# Patient Record
Sex: Female | Born: 1970 | Race: White | Hispanic: No | Marital: Married | Smoking: Never smoker
Health system: Southern US, Community
[De-identification: ages and names within clinical notes are randomized; demographics above are authoritative.]

## PROBLEM LIST (undated history)

## (undated) DIAGNOSIS — R519 Headache, unspecified: Secondary | ICD-10-CM

## (undated) HISTORY — DX: Headache, unspecified: R51.9

---

## 1997-12-29 ENCOUNTER — Encounter: Admission: RE | Admit: 1997-12-29 | Discharge: 1998-03-29 | Payer: Self-pay | Admitting: Family Medicine

## 1999-04-21 ENCOUNTER — Other Ambulatory Visit: Admission: RE | Admit: 1999-04-21 | Discharge: 1999-04-21 | Payer: Self-pay | Admitting: Obstetrics and Gynecology

## 2000-04-17 ENCOUNTER — Other Ambulatory Visit: Admission: RE | Admit: 2000-04-17 | Discharge: 2000-04-17 | Payer: Self-pay | Admitting: Obstetrics and Gynecology

## 2001-04-17 ENCOUNTER — Other Ambulatory Visit: Admission: RE | Admit: 2001-04-17 | Discharge: 2001-04-17 | Payer: Self-pay | Admitting: Obstetrics and Gynecology

## 2003-02-04 ENCOUNTER — Ambulatory Visit (HOSPITAL_COMMUNITY): Admission: RE | Admit: 2003-02-04 | Discharge: 2003-02-04 | Payer: Self-pay | Admitting: Orthopedic Surgery

## 2003-02-04 ENCOUNTER — Ambulatory Visit (HOSPITAL_BASED_OUTPATIENT_CLINIC_OR_DEPARTMENT_OTHER): Admission: RE | Admit: 2003-02-04 | Discharge: 2003-02-04 | Payer: Self-pay | Admitting: Orthopedic Surgery

## 2003-05-25 ENCOUNTER — Other Ambulatory Visit: Admission: RE | Admit: 2003-05-25 | Discharge: 2003-05-25 | Payer: Self-pay | Admitting: Obstetrics and Gynecology

## 2003-05-28 ENCOUNTER — Ambulatory Visit (HOSPITAL_COMMUNITY): Admission: RE | Admit: 2003-05-28 | Discharge: 2003-05-28 | Payer: Self-pay | Admitting: Obstetrics and Gynecology

## 2004-05-30 ENCOUNTER — Other Ambulatory Visit: Admission: RE | Admit: 2004-05-30 | Discharge: 2004-05-30 | Payer: Self-pay | Admitting: Obstetrics and Gynecology

## 2005-09-14 ENCOUNTER — Inpatient Hospital Stay (HOSPITAL_COMMUNITY): Admission: AD | Admit: 2005-09-14 | Discharge: 2005-09-17 | Payer: Self-pay | Admitting: Obstetrics and Gynecology

## 2012-02-12 ENCOUNTER — Other Ambulatory Visit: Payer: Self-pay | Admitting: Family Medicine

## 2012-02-12 ENCOUNTER — Other Ambulatory Visit (HOSPITAL_COMMUNITY)
Admission: RE | Admit: 2012-02-12 | Discharge: 2012-02-12 | Disposition: A | Discharge status: Home / Self Care | Payer: BC Managed Care – PPO | Source: Ambulatory Visit | Attending: Family Medicine | Admitting: Family Medicine

## 2012-02-12 DIAGNOSIS — Z124 Encounter for screening for malignant neoplasm of cervix: Secondary | ICD-10-CM | POA: Insufficient documentation

## 2013-10-16 ENCOUNTER — Other Ambulatory Visit: Payer: Self-pay

## 2013-10-16 DIAGNOSIS — Z1231 Encounter for screening mammogram for malignant neoplasm of breast: Secondary | ICD-10-CM

## 2013-10-29 ENCOUNTER — Ambulatory Visit
Admission: RE | Admit: 2013-10-29 | Discharge: 2013-10-29 | Disposition: A | Discharge status: Home / Self Care | Payer: BC Managed Care – PPO | Source: Ambulatory Visit

## 2013-10-29 DIAGNOSIS — Z1231 Encounter for screening mammogram for malignant neoplasm of breast: Secondary | ICD-10-CM

## 2014-09-28 ENCOUNTER — Other Ambulatory Visit: Payer: Self-pay

## 2014-09-28 DIAGNOSIS — Z1231 Encounter for screening mammogram for malignant neoplasm of breast: Secondary | ICD-10-CM

## 2014-11-02 ENCOUNTER — Ambulatory Visit
Admission: RE | Admit: 2014-11-02 | Discharge: 2014-11-02 | Disposition: A | Discharge status: Home / Self Care | Payer: BLUE CROSS/BLUE SHIELD | Source: Ambulatory Visit

## 2014-11-02 DIAGNOSIS — Z1231 Encounter for screening mammogram for malignant neoplasm of breast: Secondary | ICD-10-CM

## 2015-03-16 ENCOUNTER — Other Ambulatory Visit: Payer: Self-pay | Admitting: Family Medicine

## 2015-03-16 ENCOUNTER — Other Ambulatory Visit (HOSPITAL_COMMUNITY)
Admission: RE | Admit: 2015-03-16 | Discharge: 2015-03-16 | Disposition: A | Discharge status: Home / Self Care | Payer: BLUE CROSS/BLUE SHIELD | Source: Ambulatory Visit | Attending: Family Medicine | Admitting: Family Medicine

## 2015-03-16 DIAGNOSIS — Z124 Encounter for screening for malignant neoplasm of cervix: Secondary | ICD-10-CM | POA: Diagnosis not present

## 2015-03-18 LAB — CYTOLOGY - PAP

## 2015-12-22 ENCOUNTER — Other Ambulatory Visit: Payer: Self-pay | Admitting: Family Medicine

## 2015-12-22 DIAGNOSIS — Z1231 Encounter for screening mammogram for malignant neoplasm of breast: Secondary | ICD-10-CM

## 2016-01-17 ENCOUNTER — Ambulatory Visit: Payer: BLUE CROSS/BLUE SHIELD

## 2016-01-31 ENCOUNTER — Ambulatory Visit
Admission: RE | Admit: 2016-01-31 | Discharge: 2016-01-31 | Disposition: A | Discharge status: Home / Self Care | Payer: BC Managed Care – PPO | Source: Ambulatory Visit | Attending: Family Medicine | Admitting: Family Medicine

## 2016-01-31 DIAGNOSIS — Z1231 Encounter for screening mammogram for malignant neoplasm of breast: Secondary | ICD-10-CM

## 2016-11-08 ENCOUNTER — Other Ambulatory Visit: Payer: Self-pay | Admitting: Family Medicine

## 2016-11-08 DIAGNOSIS — Z1231 Encounter for screening mammogram for malignant neoplasm of breast: Secondary | ICD-10-CM

## 2016-12-10 ENCOUNTER — Ambulatory Visit: Payer: BC Managed Care – PPO

## 2017-01-04 ENCOUNTER — Ambulatory Visit
Admission: RE | Admit: 2017-01-04 | Discharge: 2017-01-04 | Disposition: A | Discharge status: Home / Self Care | Payer: BC Managed Care – PPO | Source: Ambulatory Visit | Attending: Family Medicine | Admitting: Family Medicine

## 2017-01-04 DIAGNOSIS — Z1231 Encounter for screening mammogram for malignant neoplasm of breast: Secondary | ICD-10-CM

## 2017-11-26 ENCOUNTER — Other Ambulatory Visit: Payer: Self-pay | Admitting: Family Medicine

## 2017-11-26 DIAGNOSIS — Z1231 Encounter for screening mammogram for malignant neoplasm of breast: Secondary | ICD-10-CM

## 2018-01-09 ENCOUNTER — Ambulatory Visit
Admission: RE | Admit: 2018-01-09 | Discharge: 2018-01-09 | Disposition: A | Discharge status: Home / Self Care | Payer: BC Managed Care – PPO | Source: Ambulatory Visit | Attending: Family Medicine | Admitting: Family Medicine

## 2018-01-09 DIAGNOSIS — Z1231 Encounter for screening mammogram for malignant neoplasm of breast: Secondary | ICD-10-CM

## 2018-12-24 ENCOUNTER — Other Ambulatory Visit: Payer: Self-pay | Admitting: Family Medicine

## 2018-12-24 DIAGNOSIS — Z1231 Encounter for screening mammogram for malignant neoplasm of breast: Secondary | ICD-10-CM

## 2019-02-06 ENCOUNTER — Other Ambulatory Visit: Payer: Self-pay

## 2019-02-06 ENCOUNTER — Ambulatory Visit
Admission: RE | Admit: 2019-02-06 | Discharge: 2019-02-06 | Disposition: A | Discharge status: Home / Self Care | Payer: BC Managed Care – PPO | Source: Ambulatory Visit | Attending: Family Medicine | Admitting: Family Medicine

## 2019-02-06 DIAGNOSIS — Z1231 Encounter for screening mammogram for malignant neoplasm of breast: Secondary | ICD-10-CM

## 2019-02-10 ENCOUNTER — Other Ambulatory Visit: Payer: Self-pay | Admitting: Family Medicine

## 2019-02-10 DIAGNOSIS — N6489 Other specified disorders of breast: Secondary | ICD-10-CM

## 2019-02-11 ENCOUNTER — Other Ambulatory Visit: Payer: Self-pay | Admitting: Nurse Practitioner

## 2019-02-11 DIAGNOSIS — N6489 Other specified disorders of breast: Secondary | ICD-10-CM

## 2019-02-24 ENCOUNTER — Ambulatory Visit
Admission: RE | Admit: 2019-02-24 | Discharge: 2019-02-24 | Disposition: A | Discharge status: Home / Self Care | Payer: BC Managed Care – PPO | Source: Ambulatory Visit | Attending: Family Medicine | Admitting: Family Medicine

## 2019-02-24 ENCOUNTER — Other Ambulatory Visit: Payer: Self-pay

## 2019-02-24 ENCOUNTER — Ambulatory Visit: Admission: RE | Admit: 2019-02-24 | Payer: BC Managed Care – PPO | Source: Ambulatory Visit

## 2019-02-24 DIAGNOSIS — N6489 Other specified disorders of breast: Secondary | ICD-10-CM

## 2019-07-14 ENCOUNTER — Other Ambulatory Visit: Payer: Self-pay | Admitting: Family Medicine

## 2019-07-14 ENCOUNTER — Other Ambulatory Visit (HOSPITAL_COMMUNITY)
Admission: RE | Admit: 2019-07-14 | Discharge: 2019-07-14 | Disposition: A | Discharge status: Home / Self Care | Payer: BC Managed Care – PPO | Source: Ambulatory Visit | Attending: Family Medicine | Admitting: Family Medicine

## 2019-07-14 DIAGNOSIS — Z124 Encounter for screening for malignant neoplasm of cervix: Secondary | ICD-10-CM | POA: Insufficient documentation

## 2019-07-16 LAB — CYTOLOGY - PAP: Diagnosis: NEGATIVE

## 2019-11-16 ENCOUNTER — Other Ambulatory Visit: Payer: Self-pay | Admitting: Family Medicine

## 2019-11-16 DIAGNOSIS — Z1231 Encounter for screening mammogram for malignant neoplasm of breast: Secondary | ICD-10-CM

## 2020-02-23 ENCOUNTER — Encounter: Payer: Self-pay | Admitting: Neurology

## 2020-02-26 ENCOUNTER — Ambulatory Visit
Admission: RE | Admit: 2020-02-26 | Discharge: 2020-02-26 | Disposition: A | Discharge status: Home / Self Care | Payer: BC Managed Care – PPO | Source: Ambulatory Visit | Attending: Family Medicine | Admitting: Family Medicine

## 2020-02-26 ENCOUNTER — Other Ambulatory Visit: Payer: Self-pay

## 2020-02-26 DIAGNOSIS — Z1231 Encounter for screening mammogram for malignant neoplasm of breast: Secondary | ICD-10-CM

## 2020-03-02 ENCOUNTER — Other Ambulatory Visit: Payer: Self-pay | Admitting: Family Medicine

## 2020-03-02 DIAGNOSIS — R928 Other abnormal and inconclusive findings on diagnostic imaging of breast: Secondary | ICD-10-CM

## 2020-03-18 ENCOUNTER — Ambulatory Visit
Admission: RE | Admit: 2020-03-18 | Discharge: 2020-03-18 | Disposition: A | Discharge status: Home / Self Care | Payer: BC Managed Care – PPO | Source: Ambulatory Visit | Attending: Family Medicine | Admitting: Family Medicine

## 2020-03-18 ENCOUNTER — Other Ambulatory Visit: Payer: Self-pay

## 2020-03-18 DIAGNOSIS — R928 Other abnormal and inconclusive findings on diagnostic imaging of breast: Secondary | ICD-10-CM

## 2020-04-29 ENCOUNTER — Ambulatory Visit: Payer: Self-pay | Admitting: Neurology

## 2020-06-29 NOTE — Progress Notes (Signed)
NEUROLOGY CONSULTATION NOTE  Shannon Hanna MRN: [id number] DOB: 01-21-1970  Referring provider: Maurice Small, MD Primary care provider: Maurice Small, MD  Reason for consult:  headache, vertigo  Assessment/Plan:    Probable peripheral vertigo - I do not think it is migrainous and I do not think it is secondary to an intracranial abnormality Headaches may be migraines, however they are infrequent and manageable  No further workup warranted.  If there is concern for OSA, recommend that it is evaluated.  Otherwise, follow up as needed.   Subjective:  Shannon Hanna is a 50 year old right-handed female who presents for headaches and vertigo.  History supplemented by referring provider's note.  Sitting upright at work and then later standing in the playground - spinning - double vision - no headache, nausea, lasted 5 to 7 minutes  In October, she was sitting sitting at work when she developed sudden onset spinning sensation with double vision.  No associated headache, nausea, or lateralizing symptoms.  Later that day, she was standing outside when it occurred again.  Both episodes lasted 5-7 minutes.  In April, she was eating lunch when she went to stand up and the vertigo occurred again, also lasting about 5-7 minutes.  She has not had a recurrence since then.    For a couple of years, she reports headache described as severe pounding pain in the occipital region and sometimes also involving the frontal region.  There is associated photophobia and phonophobia but no nausea, vomiting, visual disturbance or dizziness.  She will treat with caffeine and Tylenol.  It lasts about 30 minutes.  They are sporadic and infrequent.    She does not drink coffee but may have one caffeinated beverage a day, often a Mt Dew.  She usually drinks 40-50 oz water, sometimes less during the summer when she is out of work (she is an Data processing manager).  She does not exercise.  She snores and there  is question of underlying OSA.  CBC and CMP from February reviewed and were unremarkable except for anemia.    Current medications:  sertraline 50mg  daily.  PAST MEDICAL HISTORY: Past Medical History:  Diagnosis Date   Headache     PAST SURGICAL HISTORY: No past surgical history on file.   MEDICATIONS: Current Outpatient Medications on File Prior to Visit  Medication Sig Dispense Refill   ezetimibe (ZETIA) 10 MG tablet 1 tablet     sertraline (ZOLOFT) 50 MG tablet 1 tablet     No current facility-administered medications on file prior to visit.      ALLERGIES: Allergies  Allergen Reactions   Statins Rash    FAMILY HISTORY: No family history on file.  Objective:  Blood pressure (!) 174/95, pulse 97, height 5\' 6"  (1.676 m), weight 206 lb (93.4 kg), SpO2 97 %. General: No acute distress.  Patient appears well-groomed.   Head:  Normocephalic/atraumatic Eyes:  fundi examined but not visualized Neck: supple, no paraspinal tenderness, full range of motion Back: No paraspinal tenderness Heart: regular rate and rhythm Lungs: Clear to auscultation bilaterally. Vascular: No carotid bruits. Neurological Exam: Mental status: alert and oriented to person, place, and time, recent and remote memory intact, fund of knowledge intact, attention and concentration intact, speech fluent and not dysarthric, language intact. Cranial nerves: CN I: not tested CN II: pupils equal, round and reactive to light, visual fields intact CN III, IV, VI:  full range of motion, no nystagmus, no ptosis CN V: facial sensation  intact. CN VII: upper and lower face symmetric CN VIII: hearing intact CN IX, X: gag intact, uvula midline CN XI: sternocleidomastoid and trapezius muscles intact CN XII: tongue midline Bulk & Tone: normal, no fasciculations. Motor:  muscle strength 5/5 throughout Sensation:  Pinprick, temperature and vibratory sensation intact. Deep Tendon Reflexes:  2+ throughout,  toes  downgoing.   Finger to nose testing:  Without dysmetria.   Heel to shin:  Without dysmetria.   Gait:  Normal station and stride.  Romberg negative.    Thank you for allowing me to take part in the care of this patient.  Shon Millet, DO  CC: Maurice Small, MD

## 2020-06-30 ENCOUNTER — Ambulatory Visit: Payer: BC Managed Care – PPO | Admitting: Neurology

## 2020-06-30 ENCOUNTER — Other Ambulatory Visit: Payer: Self-pay

## 2020-06-30 ENCOUNTER — Encounter: Payer: Self-pay | Admitting: Neurology

## 2020-06-30 VITALS — BP 174/95 | HR 97 | Ht 66.0 in | Wt 206.0 lb

## 2020-06-30 DIAGNOSIS — R42 Dizziness and giddiness: Secondary | ICD-10-CM

## 2020-06-30 DIAGNOSIS — G43009 Migraine without aura, not intractable, without status migrainosus: Secondary | ICD-10-CM | POA: Diagnosis not present

## 2020-06-30 NOTE — Patient Instructions (Signed)
I think the dizzy episodes are just regular vertigo.  I don't think it is related to migraines and definitely not due to anything in the brain.  The headaches that you have are probably migraines.  As long as they are infrequent, I wouldn't do anything more.

## 2020-12-28 ENCOUNTER — Other Ambulatory Visit: Payer: Self-pay | Admitting: Family Medicine

## 2020-12-28 DIAGNOSIS — Z1231 Encounter for screening mammogram for malignant neoplasm of breast: Secondary | ICD-10-CM

## 2021-03-02 ENCOUNTER — Ambulatory Visit
Admission: RE | Admit: 2021-03-02 | Discharge: 2021-03-02 | Disposition: A | Discharge status: Home / Self Care | Payer: BC Managed Care – PPO | Source: Ambulatory Visit | Attending: Family Medicine | Admitting: Family Medicine

## 2021-03-02 DIAGNOSIS — Z1231 Encounter for screening mammogram for malignant neoplasm of breast: Secondary | ICD-10-CM

## 2021-06-30 ENCOUNTER — Ambulatory Visit: Payer: BC Managed Care – PPO | Admitting: Neurology

## 2021-11-01 ENCOUNTER — Ambulatory Visit (INDEPENDENT_AMBULATORY_CARE_PROVIDER_SITE_OTHER): Payer: BC Managed Care – PPO

## 2021-11-01 ENCOUNTER — Encounter: Payer: Self-pay | Admitting: Podiatry

## 2021-11-01 ENCOUNTER — Ambulatory Visit: Payer: BC Managed Care – PPO | Admitting: Podiatry

## 2021-11-01 DIAGNOSIS — M7661 Achilles tendinitis, right leg: Secondary | ICD-10-CM

## 2021-11-01 DIAGNOSIS — M7662 Achilles tendinitis, left leg: Secondary | ICD-10-CM | POA: Diagnosis not present

## 2021-11-01 MED ORDER — DICLOFENAC SODIUM 75 MG PO TBEC: 75.0000 mg | DELAYED_RELEASE_TABLET | Freq: Two times a day (BID) | ORAL | 2 refills | Status: AC

## 2021-11-01 MED ORDER — TRIAMCINOLONE ACETONIDE 10 MG/ML IJ SUSP
10.0000 mg | Freq: Once | INTRAMUSCULAR | Status: AC
  Administered 2021-11-01: 10 mg

## 2021-11-01 NOTE — Progress Notes (Signed)
Subjective:   Patient ID: Shannon Hanna, female   DOB: 51 y.o.   MRN: [id number]   HPI Patient presents with a lot of pain in the posterior Achilles tendon left over right stating its been hurting for a fairly long time it is worsened over the last several months and states it is very very hard to wear shoe gear or be active.  Did not have issues prior to the last year and does not smoke likes to be active   Review of Systems  All other systems reviewed and are negative.       Objective:  Physical Exam Vitals and nursing note reviewed.  Constitutional:      Appearance: She is well-developed.  Pulmonary:     Effort: Pulmonary effort is normal.  Musculoskeletal:        General: Normal range of motion.  Skin:    General: Skin is warm.  Neurological:     Mental Status: She is alert.     Neurovascular status intact muscle strength adequate range of motion within normal limits with posterior discomfort in the Achilles at insertion mostly on the medial side left over right.  Tendon appears to be strong but it is very sore in this area with minimal central lateral tendon involvement and has good digital perfusion well-oriented x3     Assessment:  Acute Achilles tendinitis left over right with pain mostly on the medial side and patient desperate for some form of relief     Plan:  8 H&P x-rays reviewed condition discussed at great length.  I did discuss ultimately that surgery may be necessary but we will get a try to do everything we can to avoid that and I did explain injection and risk of rupture associated with it.  She wants to go through with this understanding risk and I did on the left one carefully injected the medial side staying away from the central lateral portion of the tendon 3 mg Dexasone Kenalog 5 mg Xylocaine and then applied air fracture walker which was properly fitted to her lower leg to keep the pressure off the Achilles and hopefully allow for healing.   Understands ultimate surgery may be necessary  X-rays indicate very large spur formation that are partially detached heel left over right

## 2021-11-01 NOTE — Patient Instructions (Signed)

## 2021-11-29 ENCOUNTER — Ambulatory Visit: Payer: BC Managed Care – PPO | Admitting: Podiatry

## 2021-11-29 ENCOUNTER — Encounter: Payer: Self-pay | Admitting: Podiatry

## 2021-11-29 DIAGNOSIS — M7661 Achilles tendinitis, right leg: Secondary | ICD-10-CM | POA: Diagnosis not present

## 2021-11-29 DIAGNOSIS — M7662 Achilles tendinitis, left leg: Secondary | ICD-10-CM

## 2021-11-30 NOTE — Progress Notes (Signed)
Subjective:   Patient ID: Shannon Hanna, female   DOB: 51 y.o.   MRN: [id number]   HPI Patient states she is feeling much better with her left Achilles doing better stating that the right is only bothering her slightly and that the boot has done well she is still wearing it   ROS      Objective:  Physical Exam  Neurovascular status intact muscle strength was found to be adequate no equinus condition with better range of motion than at last visit.  The discomfort on the posterior left heel is significantly reduced mild discomfort upon deep palpation but much better than it was     Assessment:  Acute Achilles tendinitis left over right that is done much better with immobilization     Plan:  H&P education concerning deformity and discussed continued immobilization with gradual reduction over the next couple weeks elevation ice therapy.  Patient will be seen back as needed encouraged to call questions concerns

## 2022-01-17 ENCOUNTER — Other Ambulatory Visit: Payer: Self-pay | Admitting: Internal Medicine

## 2022-01-17 DIAGNOSIS — Z1231 Encounter for screening mammogram for malignant neoplasm of breast: Secondary | ICD-10-CM

## 2022-03-07 ENCOUNTER — Ambulatory Visit
Admission: RE | Admit: 2022-03-07 | Discharge: 2022-03-07 | Disposition: A | Discharge status: Home / Self Care | Payer: BC Managed Care – PPO | Source: Ambulatory Visit | Attending: Internal Medicine | Admitting: Internal Medicine

## 2022-03-07 DIAGNOSIS — Z1231 Encounter for screening mammogram for malignant neoplasm of breast: Secondary | ICD-10-CM

## 2022-05-31 ENCOUNTER — Other Ambulatory Visit (HOSPITAL_COMMUNITY): Payer: Self-pay | Admitting: Internal Medicine

## 2022-05-31 DIAGNOSIS — R1310 Dysphagia, unspecified: Secondary | ICD-10-CM

## 2022-06-11 ENCOUNTER — Ambulatory Visit: Payer: BC Managed Care – PPO | Admitting: Podiatry

## 2022-06-11 ENCOUNTER — Ambulatory Visit (INDEPENDENT_AMBULATORY_CARE_PROVIDER_SITE_OTHER): Payer: BC Managed Care – PPO

## 2022-06-11 DIAGNOSIS — T148XXA Other injury of unspecified body region, initial encounter: Secondary | ICD-10-CM

## 2022-06-11 DIAGNOSIS — M7661 Achilles tendinitis, right leg: Secondary | ICD-10-CM

## 2022-06-11 DIAGNOSIS — M7662 Achilles tendinitis, left leg: Secondary | ICD-10-CM

## 2022-06-11 NOTE — Progress Notes (Signed)
Subjective:   Patient ID: Shannon Hanna, female   DOB: 52 y.o.   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  States it has been very sore and has not really done any better since we treated it about 8 months ago   ROS      Objective:  Physical Exam  Neurovascular status intact no equinus condition noted significant discomfort with inflammation posterior left heel at the insertional point tendon calcaneus with large spur formation     Assessment:  Significant spur formation left posterior heel with inflammation fluid buildup     Plan:  H&P reviewed and at this point I have recommended that this will require surgery given the long-term history size of spur.  I rex-rayed and reviewed we discussed surgery I am ordering an MRI to see how much of the tendon is involved with this condition and I explained that to the patient.  Patient will get this done soon and we should be able to schedule in July and I will based on the MRI decide who would be best to do this whether I will do this or whether I will refer her to one of the other physicians which I explained today  X-rays left indicated there is a large posterior spur detached with indications that may be within the tendon itself

## 2022-06-15 ENCOUNTER — Ambulatory Visit (HOSPITAL_COMMUNITY)
Admission: RE | Admit: 2022-06-15 | Discharge: 2022-06-15 | Disposition: A | Discharge status: Home / Self Care | Payer: BC Managed Care – PPO | Source: Ambulatory Visit | Attending: Internal Medicine | Admitting: Internal Medicine

## 2022-06-15 ENCOUNTER — Other Ambulatory Visit (HOSPITAL_COMMUNITY): Payer: Self-pay | Admitting: Internal Medicine

## 2022-06-15 DIAGNOSIS — R1310 Dysphagia, unspecified: Secondary | ICD-10-CM

## 2022-07-12 ENCOUNTER — Encounter: Payer: Self-pay | Admitting: Podiatry

## 2022-07-17 ENCOUNTER — Ambulatory Visit
Admission: RE | Admit: 2022-07-17 | Discharge: 2022-07-17 | Disposition: A | Discharge status: Home / Self Care | Payer: BC Managed Care – PPO | Source: Ambulatory Visit | Attending: Podiatry | Admitting: Podiatry

## 2022-07-17 DIAGNOSIS — M7662 Achilles tendinitis, left leg: Secondary | ICD-10-CM

## 2022-07-17 DIAGNOSIS — T148XXA Other injury of unspecified body region, initial encounter: Secondary | ICD-10-CM

## 2022-10-11 ENCOUNTER — Ambulatory Visit: Payer: BC Managed Care – PPO | Admitting: Podiatry

## 2022-10-11 ENCOUNTER — Encounter: Payer: Self-pay | Admitting: Podiatry

## 2022-10-11 DIAGNOSIS — M7661 Achilles tendinitis, right leg: Secondary | ICD-10-CM

## 2022-10-11 DIAGNOSIS — M7662 Achilles tendinitis, left leg: Secondary | ICD-10-CM

## 2022-10-11 MED ORDER — DICLOFENAC SODIUM 75 MG PO TBEC: 75.0000 mg | DELAYED_RELEASE_TABLET | Freq: Two times a day (BID) | ORAL | 2 refills | Status: AC

## 2022-10-12 NOTE — Progress Notes (Signed)
Subjective:   Patient ID: Shannon Hanna, female   DOB: 52 y.o.   MRN: [id number]   HPI Patient presents stating the pain in my heel has been severe and I want to get it fixed in December.  Patient's undergone significant conservative treatment for the last year with failure to respond and had MRI indicating inflammation of the tendon group with large posterior spur   ROS      Objective:  Physical Exam  Neurovascular status intact exquisite discomfort posterior aspect left heel inflammation fluid moderate in the right not to the same degree with equinus condition noted     Assessment:  Achilles tendinitis left over right has not responded to numerous conservative treatments including complete immobilization ice anti-inflammatories and previous injection     Plan:  H&P reviewed and it has been recommended that posterior heel resection with Achilles tendon or gastroc lengthening and reattachment with anchor system.  Dr. Loreta Ave met patient reviewed case with me and agrees and will do the surgery and see her prior to procedure to review consent form

## 2022-12-13 ENCOUNTER — Ambulatory Visit: Payer: BC Managed Care – PPO | Admitting: Podiatry

## 2022-12-13 DIAGNOSIS — M7661 Achilles tendinitis, right leg: Secondary | ICD-10-CM

## 2022-12-13 DIAGNOSIS — M7662 Achilles tendinitis, left leg: Secondary | ICD-10-CM | POA: Diagnosis not present

## 2022-12-13 DIAGNOSIS — M7732 Calcaneal spur, left foot: Secondary | ICD-10-CM | POA: Diagnosis not present

## 2022-12-13 NOTE — Progress Notes (Signed)
Subjective: No chief complaint on file.  52 year old female presents the office today for surgical consultation given ongoing chronic pain to the posterior aspect of heel.  She has been treated by Dr. Charlsie Merles for this issue for quite some time despite conservative treatment she is continue pain.  Although she does state that she went to [employer] and she did fine.  She presents today to further discuss surgery.  She was hoping that this next week.  Objective: AAO x3, NAD DP/PT pulses palpable bilaterally, CRT less than 3 seconds Tenderness palpation posterior aspect of the calcaneus on insertion of the Achilles tendon.  Prominent heel spurs present.  There is no pain with compression of calcaneus.  Achilles tendon appears to be intact equinus is present. No pain with calf compression, swelling, warmth, erythema  Assessment: Left posterior heel spur, equinus, Achilles tendinitis  Plan: -All treatment options discussed with the patient including all alternatives, risks, complications.  -We discussed the conservative as well as surgical treatment options.  She is attempted numerous conservative treatments without resolution.  We discussed surgical intervention ultimately including gastrocnemius recession, heel spur resection with debridement of the Achilles tendon and detachment, reattachment of the Achilles tendon.  After discussion she wants to proceed with this. -The incision placement as well as the postoperative course was discussed with the patient. I discussed risks of the surgery which include, but not limited to, infection, bleeding, pain, swelling, need for further surgery, delayed or nonhealing, painful or ugly scar, numbness or sensation changes, over/under correction, recurrence, transfer lesions, further deformity, hardware failure, DVT/PE, loss of toe/foot. Patient understands these risks and wishes to proceed with surgery. The surgical consent was reviewed with the patient all 3 pages  were signed. No promises or guarantees were given to the outcome of the procedure. All questions were answered to the best of my ability. Before the surgery the patient was encouraged to call the office if there is any further questions. The surgery will be performed at the [facility] on an outpatient basis. -Knee scooter ordered -Patient encouraged to call the office with any questions, concerns, change in symptoms.   No follow-ups on file.  Shannon Hanna DPM

## 2022-12-13 NOTE — Patient Instructions (Signed)

## 2023-01-22 ENCOUNTER — Other Ambulatory Visit: Payer: Self-pay | Admitting: Internal Medicine

## 2023-01-22 DIAGNOSIS — Z1231 Encounter for screening mammogram for malignant neoplasm of breast: Secondary | ICD-10-CM

## 2023-02-19 IMAGING — US US BREAST*R* LIMITED INC AXILLA
1 series · 11 of 11 positions shown · non-contrast
Comparison: Previous exam(s).

CLINICAL DATA: Recall from screening to evaluate possible single
bilateral breast masses.



[Series 1: us breast*right* limited inc axilla · 0.07mm/px · 11 of 11 slices shown]
[im 1/11]
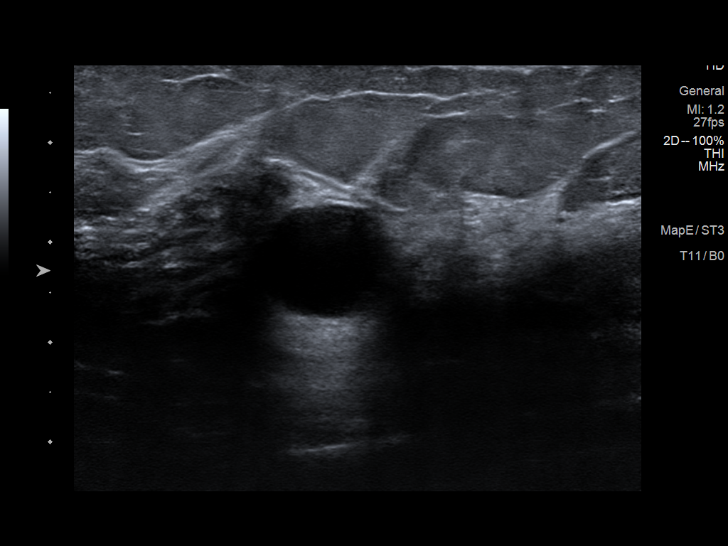
[im 2/11]
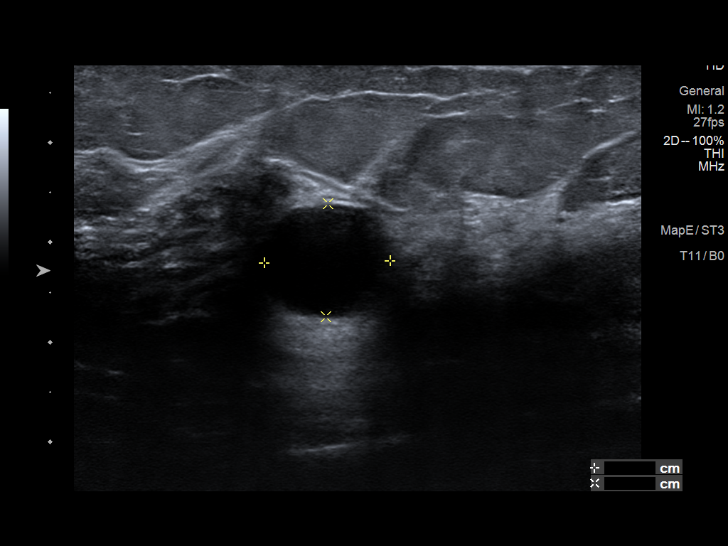
[im 3/11]
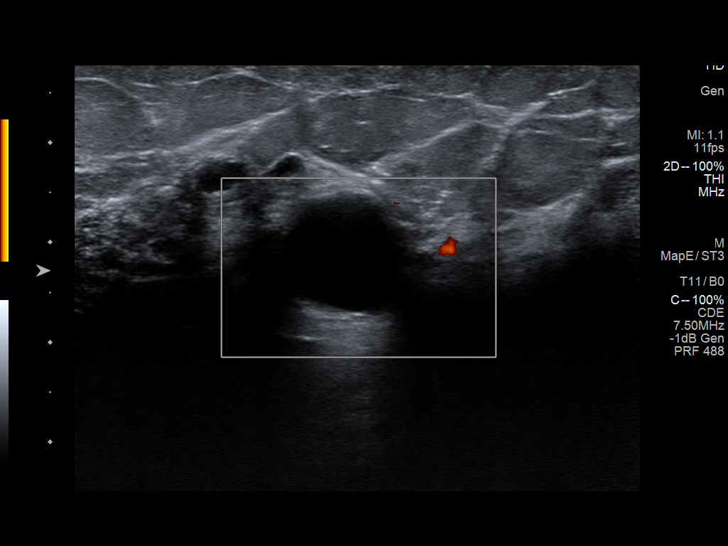
[im 4/11]
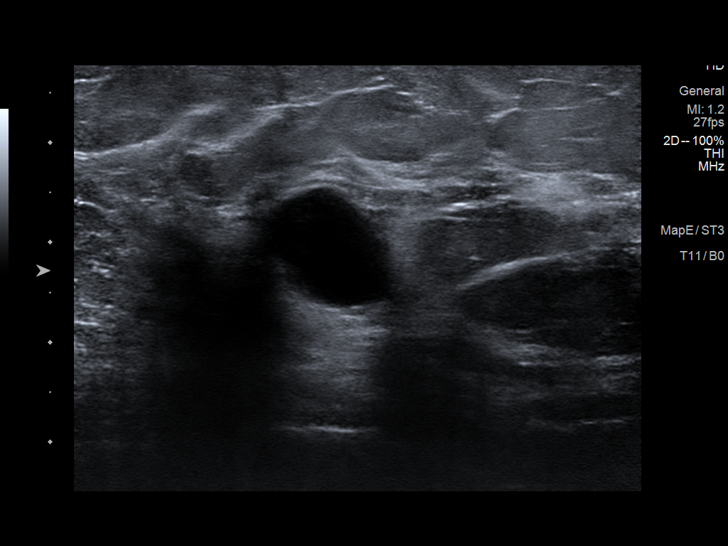
[im 5/11]
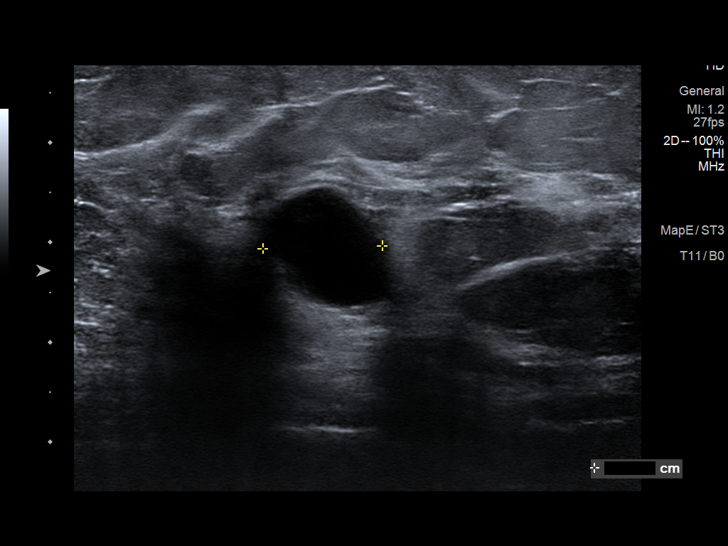
[im 6/11]
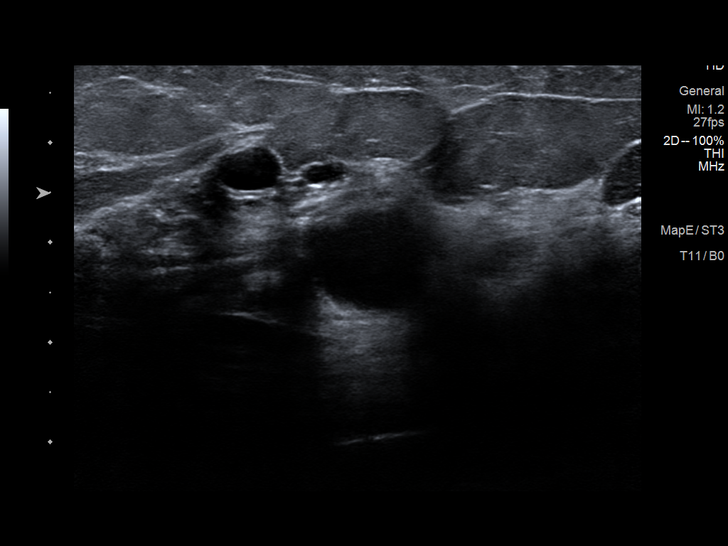
[im 7/11]
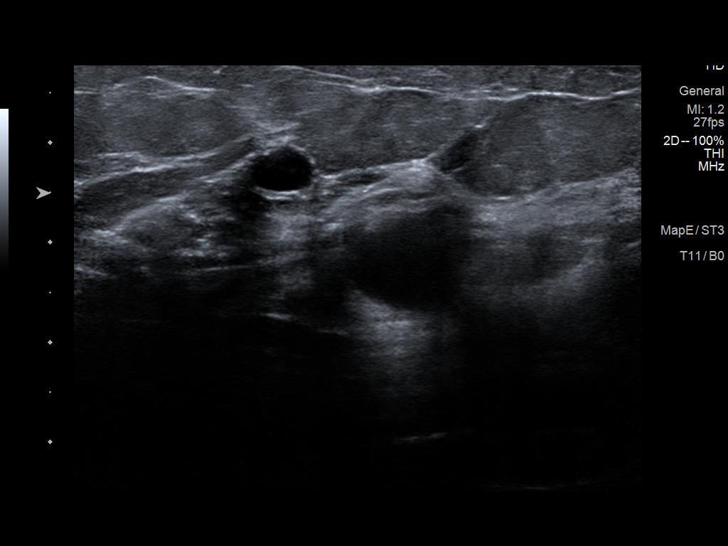
[im 8/11]
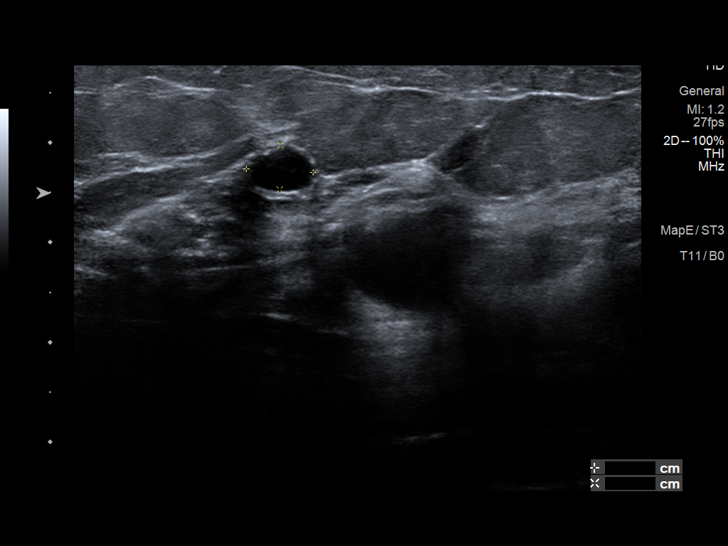
[im 9/11]
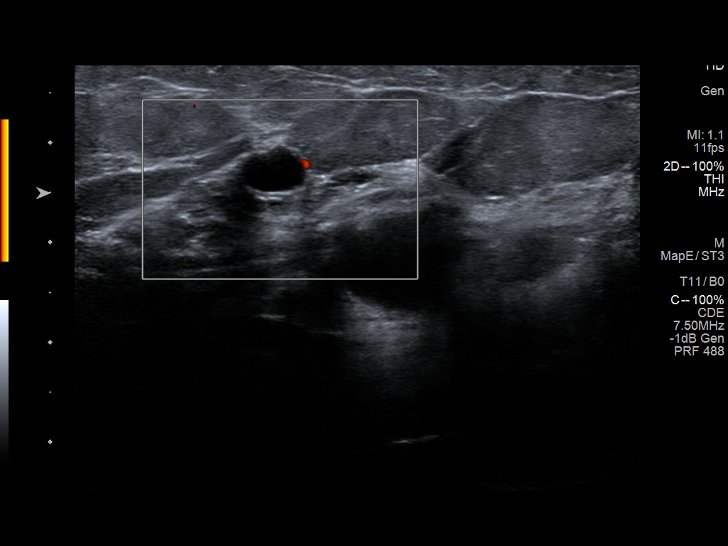
[im 10/11]
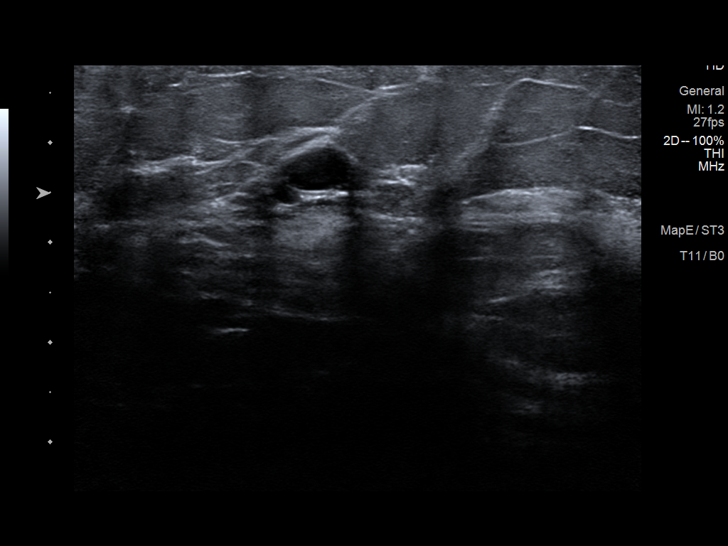
[im 11/11]
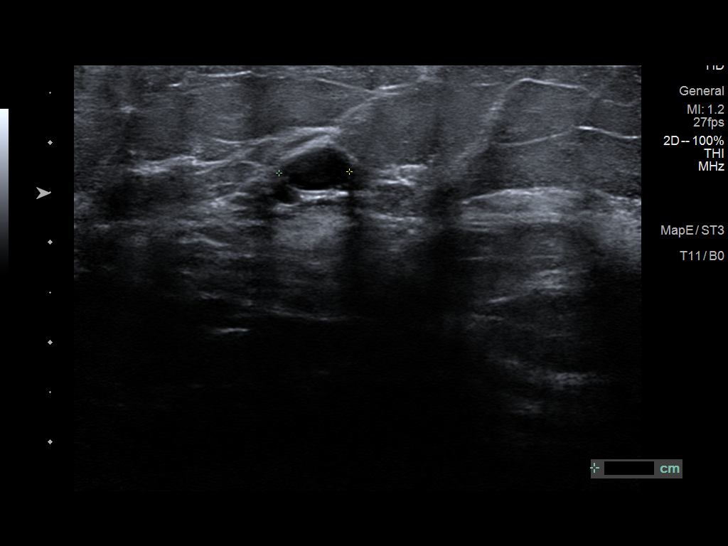

[11 of 11 positions shown; findings below may reference images not displayed]

ACR Breast Density Category c: The breast tissue is heterogeneously
dense, which may obscure small masses.
FINDINGS: Additional views of both breast were obtained. Examination
demonstrates persistence of an oval partially circumscribed and
partially obscured mass over the posterior outer midportion of the
right breast.

Additional images of the left breast demonstrate dense tissue over
the posterior inner upper left breast without definite focal mass.

Targeted ultrasound is performed, showing a simple cyst over the 9
o'clock position of the right breast 4 cm from the nipple measuring
1.1 x 1.2 x 1.3 cm corresponding to the mammographic finding. A
smaller adjacent simple cyst is also present in this region.

Ultrasound over the upper inner quadrant of the left breast
demonstrates dense fibroglandular tissue with evidence of a small
cyst at the 10 o'clock position 4 cm from the nipple measuring 5 x 7
x 8 mm. The dense fibroglandular tissue and cyst likely account for
the screening mammographic finding.
IMPRESSION: 1. 1.3 cm cyst over the 9 o'clock position of the right breast
accounting for the screening mammographic finding.

2. Overlapping dense fibroglandular tissue with an associated 8 mm
cyst over the 10 o'clock position of the left breast accounting for
the screening mammographic finding.

RECOMMENDATION:
Recommend continued annual bilateral screening mammographic
follow-up.

I have discussed the findings and recommendations with the patient.
If applicable, a reminder letter will be sent to the patient
regarding the next appointment.

BI-RADS CATEGORY  2: Benign.

## 2023-02-19 IMAGING — MG DIGITAL DIAGNOSTIC BILAT W/ TOMO W/ CAD
8 series · 8 of 24 positions shown · non-contrast
Comparison: Previous exam(s).

CLINICAL DATA: Recall from screening to evaluate possible single
bilateral breast masses.



[L MLO synth-2D]
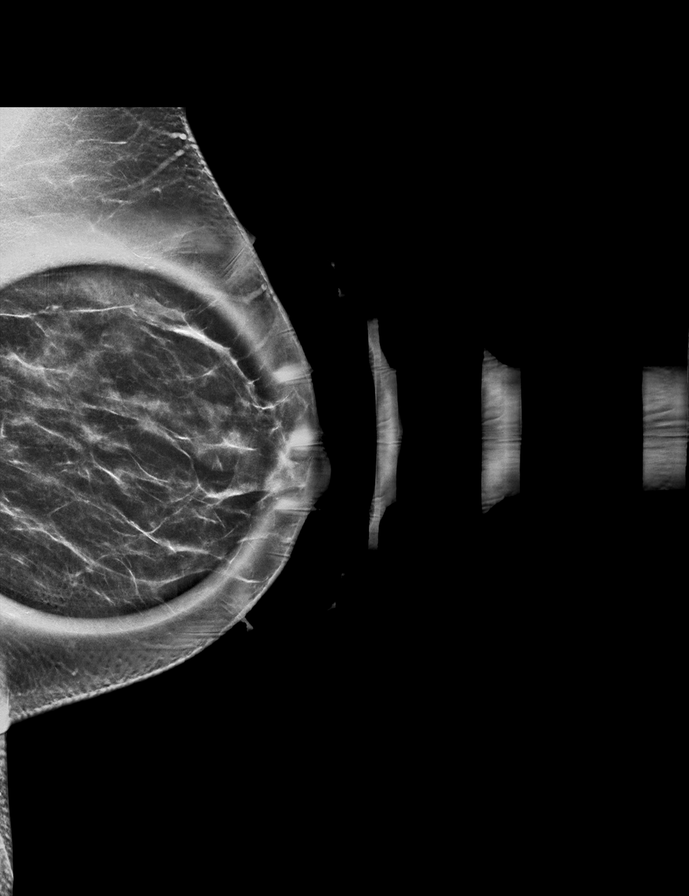

[L CC synth-2D]
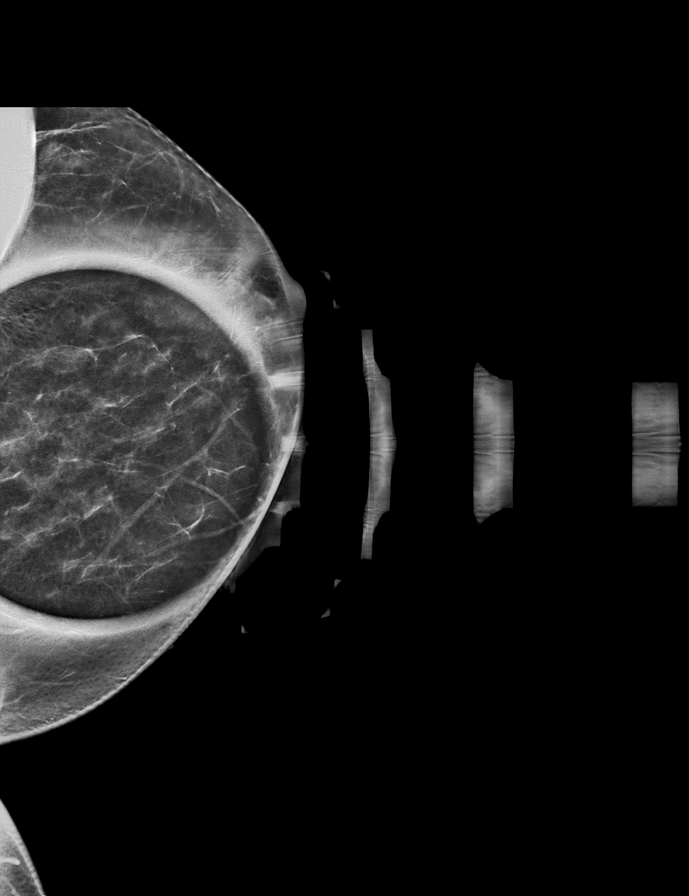

[R CC synth-2D]
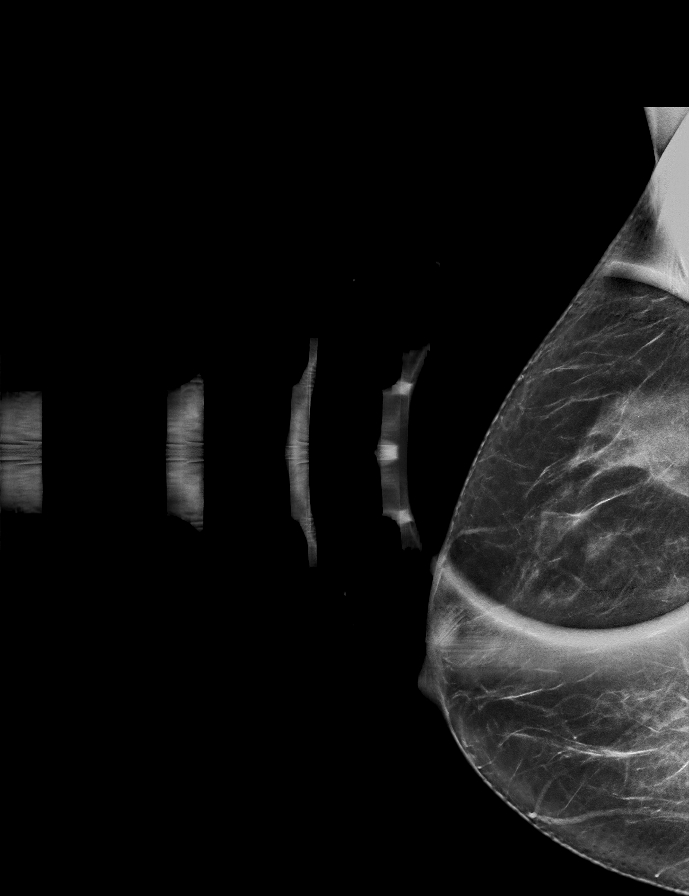

[R MLO synth-2D]
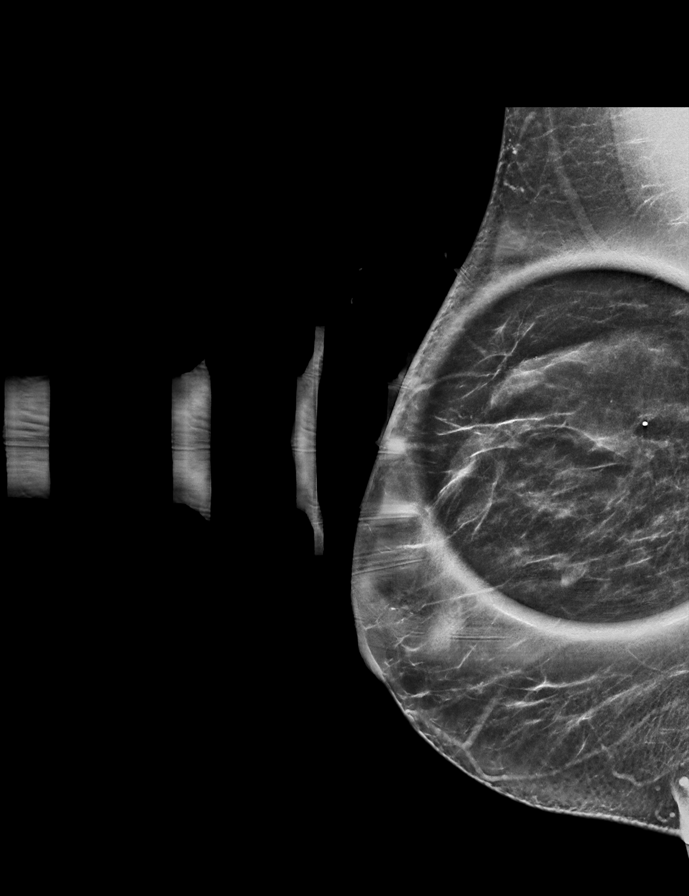

[L MLO tomo · tomo slice 33/64.0]
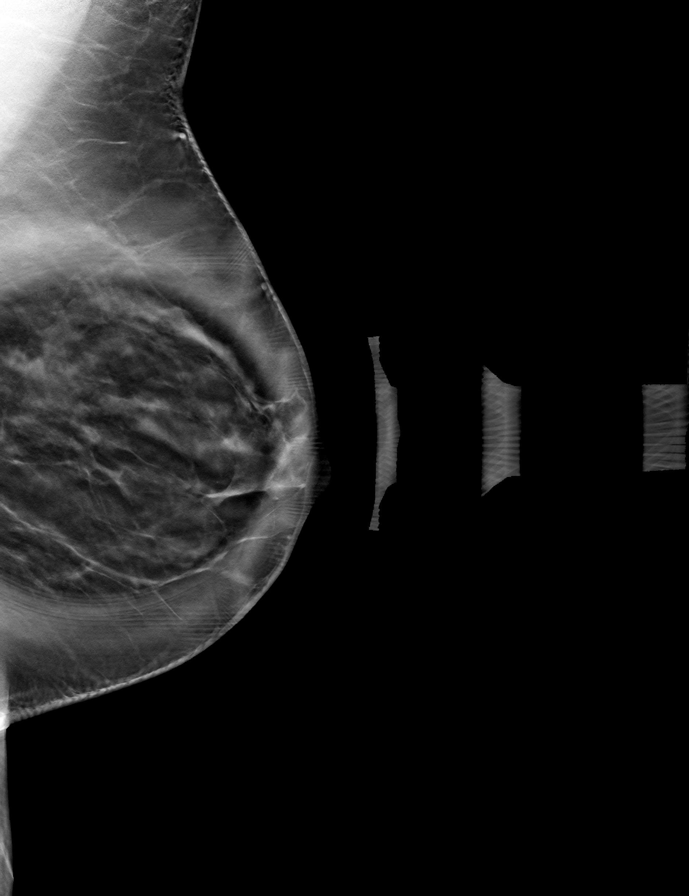

[R CC tomo · tomo slice 33/64.0]
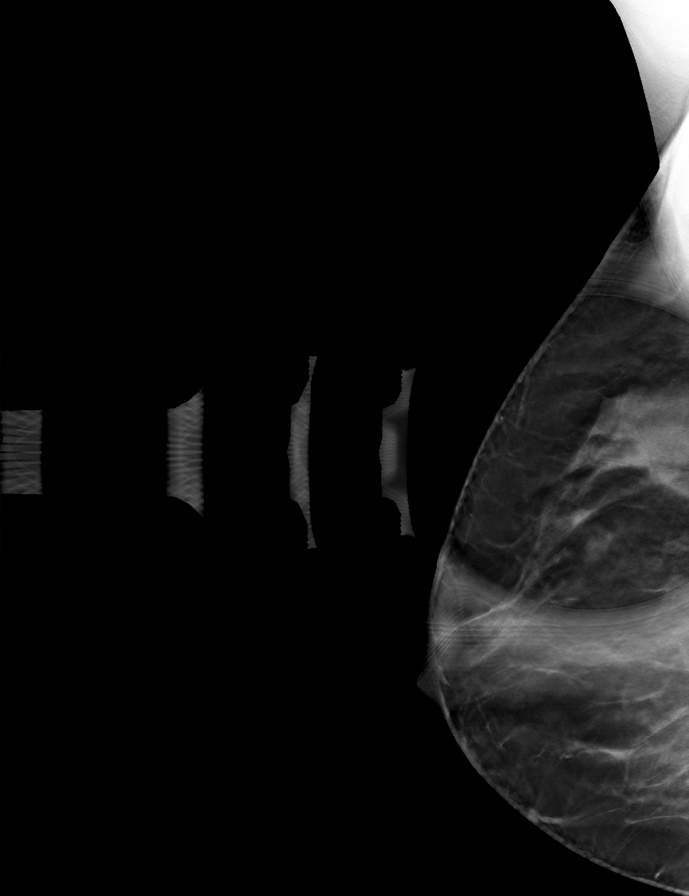

[R MLO tomo · tomo slice 32/63.0]
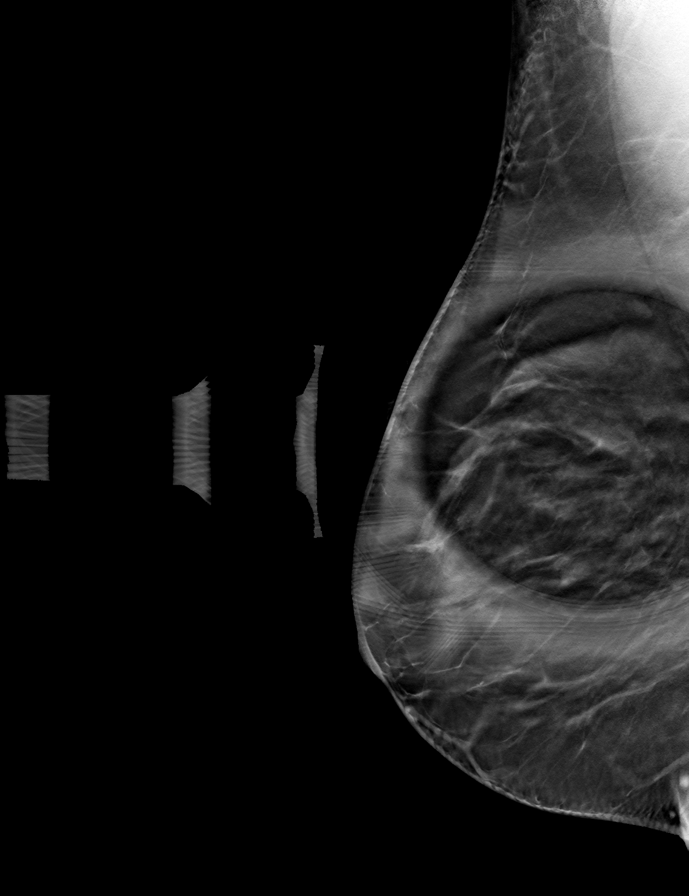

[L CC tomo · tomo slice 29/57.0]
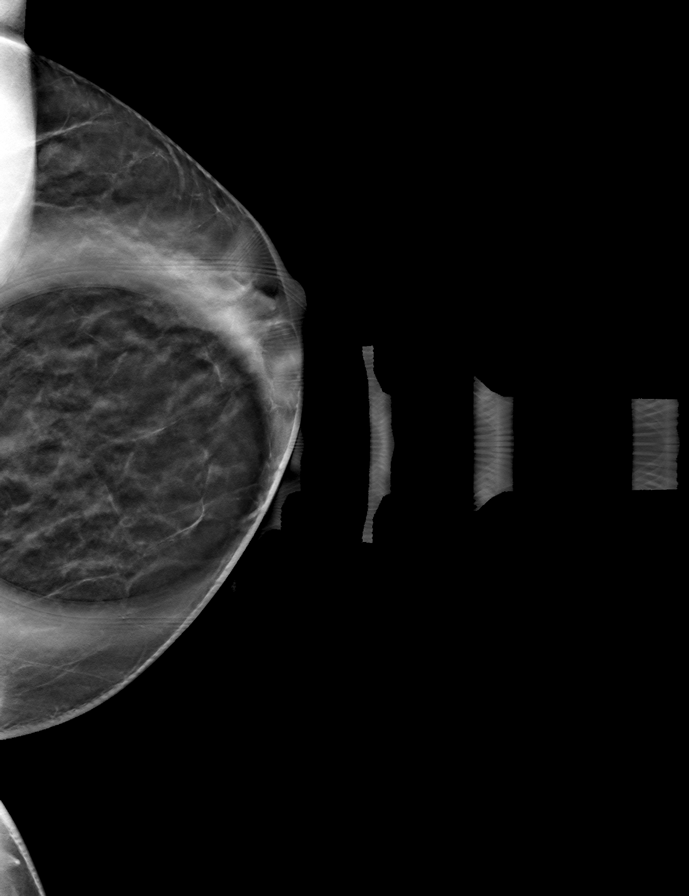

[8 of 24 positions shown; findings below may reference images not displayed]

ACR Breast Density Category c: The breast tissue is heterogeneously
dense, which may obscure small masses.
FINDINGS: Additional views of both breast were obtained. Examination
demonstrates persistence of an oval partially circumscribed and
partially obscured mass over the posterior outer midportion of the
right breast.

Additional images of the left breast demonstrate dense tissue over
the posterior inner upper left breast without definite focal mass.

Targeted ultrasound is performed, showing a simple cyst over the 9
o'clock position of the right breast 4 cm from the nipple measuring
1.1 x 1.2 x 1.3 cm corresponding to the mammographic finding. A
smaller adjacent simple cyst is also present in this region.

Ultrasound over the upper inner quadrant of the left breast
demonstrates dense fibroglandular tissue with evidence of a small
cyst at the 10 o'clock position 4 cm from the nipple measuring 5 x 7
x 8 mm. The dense fibroglandular tissue and cyst likely account for
the screening mammographic finding.
IMPRESSION: 1. 1.3 cm cyst over the 9 o'clock position of the right breast
accounting for the screening mammographic finding.

2. Overlapping dense fibroglandular tissue with an associated 8 mm
cyst over the 10 o'clock position of the left breast accounting for
the screening mammographic finding.

RECOMMENDATION:
Recommend continued annual bilateral screening mammographic
follow-up.

I have discussed the findings and recommendations with the patient.
If applicable, a reminder letter will be sent to the patient
regarding the next appointment.

BI-RADS CATEGORY  2: Benign.

## 2023-03-13 ENCOUNTER — Ambulatory Visit
Admission: RE | Admit: 2023-03-13 | Discharge: 2023-03-13 | Disposition: A | Discharge status: Home / Self Care | Payer: Self-pay | Source: Ambulatory Visit | Attending: Internal Medicine | Admitting: Internal Medicine

## 2023-03-13 DIAGNOSIS — Z1231 Encounter for screening mammogram for malignant neoplasm of breast: Secondary | ICD-10-CM
